# Patient Record
Sex: Female | Born: 1960 | Race: White | Hispanic: No | Marital: Single | Smoking: Former smoker
Health system: Southern US, Community
[De-identification: ages and names within clinical notes are randomized; demographics above are authoritative.]

## PROBLEM LIST (undated history)

## (undated) DIAGNOSIS — J309 Allergic rhinitis, unspecified: Secondary | ICD-10-CM

## (undated) DIAGNOSIS — M1990 Unspecified osteoarthritis, unspecified site: Secondary | ICD-10-CM

## (undated) DIAGNOSIS — I739 Peripheral vascular disease, unspecified: Secondary | ICD-10-CM

## (undated) DIAGNOSIS — I10 Essential (primary) hypertension: Secondary | ICD-10-CM

## (undated) DIAGNOSIS — E039 Hypothyroidism, unspecified: Secondary | ICD-10-CM

## (undated) DIAGNOSIS — Z87442 Personal history of urinary calculi: Secondary | ICD-10-CM

## (undated) HISTORY — PX: COLONOSCOPY: SHX174

## (undated) HISTORY — PX: TUBAL LIGATION: SHX77

---

## 2009-06-27 ENCOUNTER — Emergency Department: Payer: Self-pay | Admitting: Internal Medicine

## 2010-03-09 ENCOUNTER — Ambulatory Visit: Payer: Self-pay | Admitting: Obstetrics and Gynecology

## 2011-04-19 ENCOUNTER — Ambulatory Visit: Payer: Self-pay | Admitting: Family Medicine

## 2012-07-31 ENCOUNTER — Ambulatory Visit: Payer: Self-pay | Admitting: Family Medicine

## 2012-09-26 ENCOUNTER — Emergency Department: Payer: Self-pay | Admitting: Emergency Medicine

## 2012-09-26 LAB — URINALYSIS, COMPLETE
Bilirubin,UR: NEGATIVE
Glucose,UR: NEGATIVE mg/dL (ref 0–75)
Ketone: NEGATIVE
Leukocyte Esterase: NEGATIVE
Nitrite: NEGATIVE
Ph: 7 (ref 4.5–8.0)
Protein: NEGATIVE
RBC,UR: 24 /HPF (ref 0–5)
Specific Gravity: 1.018 (ref 1.003–1.030)
Squamous Epithelial: 1
WBC UR: 3 /HPF (ref 0–5)

## 2012-09-26 LAB — CBC
HCT: 39.8 % (ref 35.0–47.0)
HGB: 13.7 g/dL (ref 12.0–16.0)
MCH: 29 pg (ref 26.0–34.0)
MCHC: 34.4 g/dL (ref 32.0–36.0)
MCV: 84 fL (ref 80–100)
Platelet: 197 10*3/uL (ref 150–440)
RBC: 4.72 10*6/uL (ref 3.80–5.20)
RDW: 15.6 % — ABNORMAL HIGH (ref 11.5–14.5)
WBC: 7.7 10*3/uL (ref 3.6–11.0)

## 2012-09-26 LAB — COMPREHENSIVE METABOLIC PANEL
Albumin: 3.3 g/dL — ABNORMAL LOW (ref 3.4–5.0)
Alkaline Phosphatase: 99 U/L (ref 50–136)
Anion Gap: 8 (ref 7–16)
BUN: 17 mg/dL (ref 7–18)
Bilirubin,Total: 0.2 mg/dL (ref 0.2–1.0)
Calcium, Total: 8.5 mg/dL (ref 8.5–10.1)
Chloride: 105 mmol/L (ref 98–107)
Co2: 25 mmol/L (ref 21–32)
Creatinine: 0.82 mg/dL (ref 0.60–1.30)
EGFR (African American): >60
EGFR (Non-African Amer.): >60
Glucose: 129 mg/dL — ABNORMAL HIGH (ref 65–99)
Osmolality: 279 (ref 275–301)
Potassium: 3 mmol/L — ABNORMAL LOW (ref 3.5–5.1)
SGOT(AST): 21 U/L (ref 15–37)
SGPT (ALT): 22 U/L (ref 12–78)
Sodium: 138 mmol/L (ref 136–145)
Total Protein: 7.1 g/dL (ref 6.4–8.2)

## 2013-10-15 ENCOUNTER — Ambulatory Visit: Payer: Self-pay | Admitting: Family Medicine

## 2014-02-03 ENCOUNTER — Emergency Department: Payer: Self-pay | Admitting: Physician Assistant

## 2014-12-10 ENCOUNTER — Other Ambulatory Visit: Payer: Self-pay | Admitting: Family Medicine

## 2014-12-10 DIAGNOSIS — Z1231 Encounter for screening mammogram for malignant neoplasm of breast: Secondary | ICD-10-CM

## 2014-12-17 ENCOUNTER — Ambulatory Visit
Admission: RE | Admit: 2014-12-17 | Discharge: 2014-12-17 | Disposition: A | Discharge status: Home / Self Care | Payer: BLUE CROSS/BLUE SHIELD | Source: Ambulatory Visit | Attending: Family Medicine | Admitting: Family Medicine

## 2014-12-17 DIAGNOSIS — Z1231 Encounter for screening mammogram for malignant neoplasm of breast: Secondary | ICD-10-CM | POA: Diagnosis not present

## 2015-05-29 ENCOUNTER — Ambulatory Visit
Admission: RE | Admit: 2015-05-29 | Discharge: 2015-05-29 | Disposition: A | Discharge status: Home / Self Care | Payer: BLUE CROSS/BLUE SHIELD | Source: Ambulatory Visit | Attending: Family Medicine | Admitting: Family Medicine

## 2015-05-29 ENCOUNTER — Other Ambulatory Visit: Payer: Self-pay | Admitting: Family Medicine

## 2015-05-29 DIAGNOSIS — R600 Localized edema: Secondary | ICD-10-CM

## 2016-05-10 ENCOUNTER — Encounter: Payer: Self-pay | Admitting: *Deleted

## 2016-05-11 ENCOUNTER — Ambulatory Visit: Payer: BLUE CROSS/BLUE SHIELD | Admitting: Anesthesiology

## 2016-05-11 ENCOUNTER — Encounter: Payer: Self-pay | Admitting: Anesthesiology

## 2016-05-11 ENCOUNTER — Encounter
Admission: RE | Disposition: A | Discharge status: Home / Self Care | Payer: Self-pay | Source: Ambulatory Visit | Attending: Gastroenterology

## 2016-05-11 ENCOUNTER — Ambulatory Visit
Admission: RE | Admit: 2016-05-11 | Discharge: 2016-05-11 | Disposition: A | Discharge status: Home / Self Care | Payer: BLUE CROSS/BLUE SHIELD | Source: Ambulatory Visit | Attending: Gastroenterology | Admitting: Gastroenterology

## 2016-05-11 DIAGNOSIS — K6389 Other specified diseases of intestine: Secondary | ICD-10-CM | POA: Diagnosis not present

## 2016-05-11 DIAGNOSIS — Z87442 Personal history of urinary calculi: Secondary | ICD-10-CM | POA: Insufficient documentation

## 2016-05-11 DIAGNOSIS — I10 Essential (primary) hypertension: Secondary | ICD-10-CM | POA: Diagnosis not present

## 2016-05-11 DIAGNOSIS — I739 Peripheral vascular disease, unspecified: Secondary | ICD-10-CM | POA: Diagnosis not present

## 2016-05-11 DIAGNOSIS — Z79899 Other long term (current) drug therapy: Secondary | ICD-10-CM | POA: Diagnosis not present

## 2016-05-11 DIAGNOSIS — K635 Polyp of colon: Secondary | ICD-10-CM | POA: Diagnosis not present

## 2016-05-11 DIAGNOSIS — Z1211 Encounter for screening for malignant neoplasm of colon: Secondary | ICD-10-CM | POA: Insufficient documentation

## 2016-05-11 DIAGNOSIS — Z86718 Personal history of other venous thrombosis and embolism: Secondary | ICD-10-CM | POA: Diagnosis not present

## 2016-05-11 DIAGNOSIS — E039 Hypothyroidism, unspecified: Secondary | ICD-10-CM | POA: Diagnosis not present

## 2016-05-11 DIAGNOSIS — K644 Residual hemorrhoidal skin tags: Secondary | ICD-10-CM | POA: Insufficient documentation

## 2016-05-11 DIAGNOSIS — D125 Benign neoplasm of sigmoid colon: Secondary | ICD-10-CM | POA: Insufficient documentation

## 2016-05-11 HISTORY — DX: Personal history of urinary calculi: Z87.442

## 2016-05-11 HISTORY — DX: Essential (primary) hypertension: I10

## 2016-05-11 HISTORY — DX: Peripheral vascular disease, unspecified: I73.9

## 2016-05-11 HISTORY — DX: Hypothyroidism, unspecified: E03.9

## 2016-05-11 HISTORY — PX: COLONOSCOPY WITH PROPOFOL: SHX5780

## 2016-05-11 HISTORY — DX: Unspecified osteoarthritis, unspecified site: M19.90

## 2016-05-11 HISTORY — DX: Allergic rhinitis, unspecified: J30.9

## 2016-05-11 SURGERY — COLONOSCOPY WITH PROPOFOL
Anesthesia: General

## 2016-05-11 MED ORDER — LIDOCAINE 2% (20 MG/ML) 5 ML SYRINGE
INTRAMUSCULAR | Status: DC | PRN
  Administered 2016-05-11: 40 mg via INTRAVENOUS

## 2016-05-11 MED ORDER — LIDOCAINE HCL (PF) 1 % IJ SOLN
2.0000 mL | Freq: Once | INTRAMUSCULAR | Status: AC
  Administered 2016-05-11: 0.03 mL via INTRADERMAL

## 2016-05-11 MED ORDER — PROPOFOL 10 MG/ML IV BOLUS
INTRAVENOUS | Status: DC | PRN
  Administered 2016-05-11: 100 mg via INTRAVENOUS

## 2016-05-11 MED ORDER — MIDAZOLAM HCL 5 MG/5ML IJ SOLN
INTRAMUSCULAR | Status: DC | PRN
  Administered 2016-05-11: 1 mg via INTRAVENOUS

## 2016-05-11 MED ORDER — PROPOFOL 500 MG/50ML IV EMUL
INTRAVENOUS | Status: DC | PRN
  Administered 2016-05-11: 160 ug/kg/min via INTRAVENOUS

## 2016-05-11 MED ORDER — FENTANYL CITRATE (PF) 100 MCG/2ML IJ SOLN
INTRAMUSCULAR | Status: AC
  Filled 2016-05-11: qty 2

## 2016-05-11 MED ORDER — SODIUM CHLORIDE 0.9 % IV SOLN
INTRAVENOUS | Status: DC
  Administered 2016-05-11: 12:00:00 via INTRAVENOUS

## 2016-05-11 MED ORDER — FENTANYL CITRATE (PF) 100 MCG/2ML IJ SOLN
INTRAMUSCULAR | Status: DC | PRN
  Administered 2016-05-11: 50 ug via INTRAVENOUS

## 2016-05-11 MED ORDER — SODIUM CHLORIDE 0.9 % IV SOLN
INTRAVENOUS | Status: DC
  Administered 2016-05-11: 1000 mL via INTRAVENOUS

## 2016-05-11 MED ORDER — PROPOFOL 10 MG/ML IV BOLUS
INTRAVENOUS | Status: AC
  Filled 2016-05-11: qty 20

## 2016-05-11 MED ORDER — MIDAZOLAM HCL 2 MG/2ML IJ SOLN
INTRAMUSCULAR | Status: AC
  Filled 2016-05-11: qty 2

## 2016-05-11 MED ORDER — PROPOFOL 500 MG/50ML IV EMUL
INTRAVENOUS | Status: AC
  Filled 2016-05-11: qty 50

## 2016-05-11 MED ORDER — LIDOCAINE HCL (PF) 1 % IJ SOLN
INTRAMUSCULAR | Status: AC
  Administered 2016-05-11: 0.03 mL via INTRADERMAL
  Filled 2016-05-11: qty 2

## 2016-05-11 NOTE — Op Note (Signed)
[facility] Gastroenterology Patient Name: Carrie Cole Procedure Date: 05/11/2016 12:29 PM MRN: [id number] Account #: [id number] Date of Birth: 11/01/1960 Admit Type: Outpatient Age: 56 Room: [facility] ENDO ROOM 3 Gender: Female Note Status: Finalized Procedure:            Colonoscopy Indications:          Screening for colorectal malignant neoplasm Providers:            Lollie Sails, MD Referring MD:         Margaretha Glassing, MD (Referring MD) Medicines:            Monitored Anesthesia Care Complications:        No immediate complications. Procedure:            Pre-Anesthesia Assessment:                       - ASA Grade Assessment: III - A patient with severe                        systemic disease.                       After obtaining informed consent, the colonoscope was                        passed under direct vision. Throughout the procedure,                        the patient's blood pressure, pulse, and oxygen                        saturations were monitored continuously. The                        Colonoscope was introduced through the anus and                        advanced to the the cecum, identified by appendiceal                        orifice and ileocecal valve. The colonoscopy was                        performed without difficulty. The patient tolerated the                        procedure well. The quality of the bowel preparation                        was good except the ascending colon was fair. Findings:      A 3 mm polyp was found in the mid sigmoid colon. The polyp was sessile.       The polyp was removed with a cold biopsy forceps. Resection and       retrieval were complete.      Three sessile polyps were found in the distal sigmoid colon. The polyps       were 1 to 2 mm in size. These polyps were removed with a cold biopsy       forceps. Resection and retrieval were complete.      A diffuse area  of mild melanosis was  found in the entire colon.      The retroflexed view of the distal rectum and anal verge was normal and       showed no anal or rectal abnormalities, note prominant anal pillars.      The digital rectal exam was normal.      The perianal exam findings include skin tags. Impression:           - One 3 mm polyp in the mid sigmoid colon, removed with                        a cold biopsy forceps. Resected and retrieved.                       - Three 1 to 2 mm polyps in the distal sigmoid colon,                        removed with a cold biopsy forceps. Resected and                        retrieved.                       - Melanosis in the colon.                       - The distal rectum and anal verge are normal on                        retroflexion view.                       - Perianal skin tags found on perianal exam. Recommendation:       - Discharge patient to home.                       - Telephone GI clinic for pathology results in 1 week. Procedure Code(s):    --- Professional ---                       [phone], Colonoscopy, flexible; with biopsy, single or                        multiple Diagnosis Code(s):    --- Professional ---                       D12.5, Benign neoplasm of sigmoid colon                       Z12.11, Encounter for screening for malignant neoplasm                        of colon                       K63.89, Other specified diseases of intestine                       K64.4, Residual hemorrhoidal skin tags CPT copyright 2016 American Medical Association. All rights reserved. The codes documented in this report are preliminary and upon coder review may  be revised to meet  current compliance requirements. Lollie Sails, MD 05/11/2016 1:14:18 PM This report has been signed electronically. Number of Addenda: 0 Note Initiated On: 05/11/2016 12:29 PM Scope Withdrawal Time: 0 hours 16 minutes 40 seconds  Total Procedure Duration: 0 hours 30 minutes 16 seconds        [facility]

## 2016-05-11 NOTE — Anesthesia Postprocedure Evaluation (Signed)
Anesthesia Post Note  Patient: Carrie Cole  Procedure(s) Performed: Procedure(s) (LRB): COLONOSCOPY WITH PROPOFOL (N/A)  Patient location during evaluation: Endoscopy Anesthesia Type: General Level of consciousness: awake and alert Pain management: pain level controlled Vital Signs Assessment: post-procedure vital signs reviewed and stable Respiratory status: spontaneous breathing, nonlabored ventilation, respiratory function stable and patient connected to nasal cannula oxygen Cardiovascular status: blood pressure returned to baseline and stable Postop Assessment: no signs of nausea or vomiting Anesthetic complications: no     Last Vitals:  Vitals:   05/11/16 1327 05/11/16 1337  BP: 101/71 115/83  Pulse: 64 (!) 52  Resp: 15 12  Temp:      Last Pain:  Vitals:   05/11/16 1207  TempSrc: Tympanic                 Precious Haws Daril Warga

## 2016-05-11 NOTE — Anesthesia Preprocedure Evaluation (Signed)
Anesthesia Evaluation  Patient identified by MRN, date of birth, ID band Patient awake    Reviewed: Allergy & Precautions, NPO status , Patient's Chart, lab work & pertinent test results, reviewed documented beta blocker date and time   Airway Mallampati: II  TM Distance: >3 FB     Dental  (+) Chipped   Pulmonary former smoker,           Cardiovascular hypertension, Pt. on medications + Peripheral Vascular Disease       Neuro/Psych    GI/Hepatic   Endo/Other  Hypothyroidism   Renal/GU      Musculoskeletal  (+) Arthritis ,   Abdominal   Peds  Hematology   Anesthesia Other Findings   Reproductive/Obstetrics                             Anesthesia Physical Anesthesia Plan  ASA: III  Anesthesia Plan: General   Post-op Pain Management:    Induction: Intravenous  Airway Management Planned:   Additional Equipment:   Intra-op Plan:   Post-operative Plan:   Informed Consent: I have reviewed the patients History and Physical, chart, labs and discussed the procedure including the risks, benefits and alternatives for the proposed anesthesia with the patient or authorized representative who has indicated his/her understanding and acceptance.     Plan Discussed with: CRNA  Anesthesia Plan Comments:         Anesthesia Quick Evaluation

## 2016-05-11 NOTE — H&P (Signed)
Outpatient short stay form Pre-procedure 05/11/2016 12:10 PM Lollie Sails MD  Primary Physician: Dr. Maryland Pink  Reason for visit:  Screening colonoscopy  History of present illness:  Patient is a 56 year old female presenting today as above. She did have a colonoscopy over 10 years ago. She tolerated her prep well. She takes no aspirin or blood thinning agents.    Current Facility-Administered Medications:  .  0.9 %  sodium chloride infusion, , Intravenous, Continuous, Lollie Sails, MD .  0.9 %  sodium chloride infusion, , Intravenous, Continuous, Lollie Sails, MD .  lidocaine (PF) (XYLOCAINE) 1 % injection 2 mL, 2 mL, Intradermal, Once, Lollie Sails, MD  Prescriptions Prior to Admission  Medication Sig Dispense Refill Last Dose  . indapamide (LOZOL) 2.5 MG tablet Take 2.5 mg by mouth daily.     Marland Kitchen levothyroxine (SYNTHROID, LEVOTHROID) 75 MCG tablet Take 75 mcg by mouth daily before breakfast.        No Known Allergies   Past Medical History:  Diagnosis Date  . Allergic rhinitis   . Arthritis   . History of kidney stones   . Hypertension   . Hypothyroidism   . Peripheral vascular disease ([place])    DVT  (Left Leg)    Review of systems:      Physical Exam    Heart and lungs: Regular rate and rhythm without rub or gallop lungs are bilaterally clear.    HEENT: Normocephalic atraumatic eyes are anicteric    Other:     Pertinant exam for procedure: Soft nontender nondistended bowel sounds positive normoactive.    Planned proceedures: Colonoscopy and indicated procedures. I have discussed the risks benefits and complications of procedures to include not limited to bleeding, infection, perforation and the risk of sedation and the patient wishes to proceed.    Lollie Sails, MD Gastroenterology 05/11/2016  12:10 PM

## 2016-05-11 NOTE — Transfer of Care (Signed)
Immediate Anesthesia Transfer of Care Note  Patient: Carrie Cole  Procedure(s) Performed: Procedure(s): COLONOSCOPY WITH PROPOFOL (N/A)  Patient Location: PACU and Endoscopy Unit  Anesthesia Type:General  Level of Consciousness: awake, drowsy and patient cooperative  Airway & Oxygen Therapy: Patient Spontanous Breathing and Patient connected to nasal cannula oxygen  Post-op Assessment: Report given to RN and Post -op Vital signs reviewed and stable  Post vital signs: Reviewed and stable  Last Vitals:  Vitals:   05/11/16 1207  BP: (!) 147/63  Pulse: (!) 56  Resp: 20  Temp: 36.2 C    Last Pain:  Vitals:   05/11/16 1207  TempSrc: Tympanic         Complications: No apparent anesthesia complications

## 2016-05-11 NOTE — Anesthesia Post-op Follow-up Note (Cosign Needed)
Anesthesia QCDR form completed.        

## 2016-05-13 LAB — SURGICAL PATHOLOGY

## 2016-05-18 ENCOUNTER — Encounter: Payer: Self-pay | Admitting: Gastroenterology

## 2017-05-14 ENCOUNTER — Encounter: Payer: Self-pay | Admitting: Emergency Medicine

## 2017-05-14 ENCOUNTER — Emergency Department
Admission: EM | Admit: 2017-05-14 | Discharge: 2017-05-14 | Disposition: A | Discharge status: Home / Self Care | Payer: BLUE CROSS/BLUE SHIELD | Attending: Student in an Organized Health Care Education/Training Program | Admitting: Student in an Organized Health Care Education/Training Program

## 2017-05-14 ENCOUNTER — Emergency Department: Payer: BLUE CROSS/BLUE SHIELD

## 2017-05-14 DIAGNOSIS — E039 Hypothyroidism, unspecified: Secondary | ICD-10-CM | POA: Insufficient documentation

## 2017-05-14 DIAGNOSIS — Z87891 Personal history of nicotine dependence: Secondary | ICD-10-CM | POA: Diagnosis not present

## 2017-05-14 DIAGNOSIS — Y9389 Activity, other specified: Secondary | ICD-10-CM | POA: Diagnosis not present

## 2017-05-14 DIAGNOSIS — M545 Low back pain, unspecified: Secondary | ICD-10-CM

## 2017-05-14 DIAGNOSIS — I10 Essential (primary) hypertension: Secondary | ICD-10-CM | POA: Diagnosis not present

## 2017-05-14 DIAGNOSIS — X500XXA Overexertion from strenuous movement or load, initial encounter: Secondary | ICD-10-CM | POA: Insufficient documentation

## 2017-05-14 DIAGNOSIS — Z79899 Other long term (current) drug therapy: Secondary | ICD-10-CM | POA: Diagnosis not present

## 2017-05-14 DIAGNOSIS — Y990 Civilian activity done for income or pay: Secondary | ICD-10-CM | POA: Insufficient documentation

## 2017-05-14 DIAGNOSIS — Y9289 Other specified places as the place of occurrence of the external cause: Secondary | ICD-10-CM | POA: Diagnosis not present

## 2017-05-14 LAB — CBC
HCT: 40.3 % (ref 35.0–47.0)
Hemoglobin: 13.5 g/dL (ref 12.0–16.0)
MCH: 29.3 pg (ref 26.0–34.0)
MCHC: 33.5 g/dL (ref 32.0–36.0)
MCV: 87.5 fL (ref 80.0–100.0)
Platelets: 232 10*3/uL (ref 150–440)
RBC: 4.61 MIL/uL (ref 3.80–5.20)
RDW: 15.2 % — ABNORMAL HIGH (ref 11.5–14.5)
WBC: 6.7 10*3/uL (ref 3.6–11.0)

## 2017-05-14 LAB — COMPREHENSIVE METABOLIC PANEL
ALT: 20 U/L (ref 14–54)
AST: 20 U/L (ref 15–41)
Albumin: 3.9 g/dL (ref 3.5–5.0)
Alkaline Phosphatase: 100 U/L (ref 38–126)
Anion gap: 5 (ref 5–15)
BUN: 14 mg/dL (ref 6–20)
CO2: 24 mmol/L (ref 22–32)
Calcium: 8.6 mg/dL — ABNORMAL LOW (ref 8.9–10.3)
Chloride: 107 mmol/L (ref 101–111)
Creatinine, Ser: 0.49 mg/dL (ref 0.44–1.00)
GFR calc Af Amer: >60 mL/min (ref >60)
GFR calc non Af Amer: >60 mL/min (ref >60)
Glucose, Bld: 96 mg/dL (ref 65–99)
Potassium: 4.3 mmol/L (ref 3.5–5.1)
Sodium: 136 mmol/L (ref 135–145)
Total Bilirubin: 0.7 mg/dL (ref 0.3–1.2)
Total Protein: 7.4 g/dL (ref 6.5–8.1)

## 2017-05-14 LAB — URINALYSIS, COMPLETE (UACMP) WITH MICROSCOPIC
Bacteria, UA: NONE SEEN
Bilirubin Urine: NEGATIVE
Glucose, UA: NEGATIVE mg/dL
Hgb urine dipstick: NEGATIVE
Ketones, ur: NEGATIVE mg/dL
Nitrite: NEGATIVE
Protein, ur: NEGATIVE mg/dL
Specific Gravity, Urine: 1.02 (ref 1.005–1.030)
pH: 6 (ref 5.0–8.0)

## 2017-05-14 LAB — LIPASE, BLOOD: Lipase: 28 U/L (ref 11–51)

## 2017-05-14 MED ORDER — HYDROCODONE-ACETAMINOPHEN 5-325 MG PO TABS: 1.0000 | ORAL_TABLET | ORAL | 0 refills | Status: DC | PRN

## 2017-05-14 MED ORDER — CYCLOBENZAPRINE HCL 5 MG PO TABS: 5.0000 mg | ORAL_TABLET | Freq: Three times a day (TID) | ORAL | 0 refills | Status: DC | PRN

## 2017-05-14 NOTE — ED Provider Notes (Signed)
[facility] Emergency Department Provider Note    First MD Initiated Contact with Patient 05/14/17 1617     (approximate)  I have reviewed the triage vital signs and the nursing notes.   HISTORY  Chief Complaint Back Pain and Emesis    HPI Carrie Cole is a 57 y.o. female with history of kidney stones roughly 1 decade ago presents with acute right low back pain worsened with movement.  No fevers.  No dysuria or hematuria.  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  Denies any chest pain or shortness of breath.  States the pain is mild to moderate with some improvement after muscle relaxant that she took at home as well as with naproxen.  Past Medical History:  Diagnosis Date  . Allergic rhinitis   . Arthritis   . History of kidney stones   . Hypertension   . Hypothyroidism   . Peripheral vascular disease ([facility])    DVT  (Left Leg)   Family History  Problem Relation Age of Onset  . Breast cancer Sister        69's  . Breast cancer Paternal Aunt   . Diabetes Mellitus II Father   . Heart disease Father   . Heart attack Father   . Hypertension Father    Past Surgical History:  Procedure Laterality Date  . COLONOSCOPY    . COLONOSCOPY WITH PROPOFOL N/A 05/11/2016   Procedure: COLONOSCOPY WITH PROPOFOL;  Surgeon: Lollie Sails, MD;  Location: [facility] ENDOSCOPY;  Service: Endoscopy;  Laterality: N/A;  . TUBAL LIGATION     There are no active problems to display for this patient.     Prior to Admission medications   Medication Sig Start Date End Date Taking? Authorizing Provider  levothyroxine (SYNTHROID, LEVOTHROID) 75 MCG tablet Take 75 mcg by mouth daily before breakfast.   Yes [provider]  cyclobenzaprine (FLEXERIL) 5 MG tablet Take 1 tablet (5 mg total) by mouth 3 (three) times daily as needed for muscle spasms. 05/14/17   Merlyn Lot, MD    HYDROcodone-acetaminophen (NORCO) 5-325 MG tablet Take 1 tablet by mouth every 4 (four) hours as needed for moderate pain. 05/14/17   Merlyn Lot, MD    Allergies Patient has no known allergies.    Social History Social History   Tobacco Use  . Smoking status: Former Research scientist (life sciences)  . Smokeless tobacco: Never Used  Substance Use Topics  . Alcohol use: No  . Drug use: No    Review of Systems Patient denies headaches, rhinorrhea, blurry vision, numbness, shortness of breath, chest pain, edema, cough, abdominal pain, nausea, vomiting, diarrhea, dysuria, fevers, rashes or hallucinations unless otherwise stated above in HPI. ____________________________________________   PHYSICAL EXAM:  VITAL SIGNS: Vitals:   05/14/17 1410  BP: (!) 141/57  Pulse: (!) 56  Resp: 20  Temp: 98.3 F (36.8 C)  SpO2: 97%    Constitutional: Alert and oriented. Well appearing and in no acute distress. Eyes: Conjunctivae are normal.  Head: Atraumatic. Nose: No congestion/rhinnorhea. Mouth/Throat: Mucous membranes are moist.   Neck: No stridor. Painless ROM.  Cardiovascular: Normal rate, regular rhythm. Grossly normal heart sounds.  Good peripheral circulation. Respiratory: Normal respiratory effort.  No retractions. Lungs CTAB. Gastrointestinal: Soft and nontender. No distention. No abdominal bruits. No CVA tenderness. Genitourinary: deferred Musculoskeletal:  Right paralumbar psinal ttp, no step offs or deformities No lower extremity tenderness nor  edema.  No joint effusions. Neurologic:  Normal speech and language. No gross focal neurologic deficits are appreciated. No facial droop Skin:  Skin is warm, dry and intact. No rash noted. Psychiatric: Mood and affect are normal. Speech and behavior are normal.  ____________________________________________   LABS (all labs ordered are listed, but only abnormal results are displayed)  Results for orders placed or performed during the hospital  encounter of 05/14/17 (from the past 24 hour(s))  Lipase, blood     Status: None   Collection Time: 05/14/17  2:21 PM  Result Value Ref Range   Lipase 28 11 - 51 U/L  Comprehensive metabolic panel     Status: Abnormal   Collection Time: 05/14/17  2:21 PM  Result Value Ref Range   Sodium 136 135 - 145 mmol/L   Potassium 4.3 3.5 - 5.1 mmol/L   Chloride 107 101 - 111 mmol/L   CO2 24 22 - 32 mmol/L   Glucose, Bld 96 65 - 99 mg/dL   BUN 14 6 - 20 mg/dL   Creatinine, Ser 0.49 0.44 - 1.00 mg/dL   Calcium 8.6 (L) 8.9 - 10.3 mg/dL   Total Protein 7.4 6.5 - 8.1 g/dL   Albumin 3.9 3.5 - 5.0 g/dL   AST 20 15 - 41 U/L   ALT 20 14 - 54 U/L   Alkaline Phosphatase 100 38 - 126 U/L   Total Bilirubin 0.7 0.3 - 1.2 mg/dL   GFR calc non Af Amer >60 >60 mL/min   GFR calc Af Amer >60 >60 mL/min   Anion gap 5 5 - 15  CBC     Status: Abnormal   Collection Time: 05/14/17  2:21 PM  Result Value Ref Range   WBC 6.7 3.6 - 11.0 K/uL   RBC 4.61 3.80 - 5.20 MIL/uL   Hemoglobin 13.5 12.0 - 16.0 g/dL   HCT 40.3 35.0 - 47.0 %   MCV 87.5 80.0 - 100.0 fL   MCH 29.3 26.0 - 34.0 pg   MCHC 33.5 32.0 - 36.0 g/dL   RDW 15.2 (H) 11.5 - 14.5 %   Platelets 232 150 - 440 K/uL  Urinalysis, Complete w Microscopic     Status: Abnormal   Collection Time: 05/14/17  2:21 PM  Result Value Ref Range   Color, Urine YELLOW (A) YELLOW   APPearance HAZY (A) CLEAR   Specific Gravity, Urine 1.020 1.005 - 1.030   pH 6.0 5.0 - 8.0   Glucose, UA NEGATIVE NEGATIVE mg/dL   Hgb urine dipstick NEGATIVE NEGATIVE   Bilirubin Urine NEGATIVE NEGATIVE   Ketones, ur NEGATIVE NEGATIVE mg/dL   Protein, ur NEGATIVE NEGATIVE mg/dL   Nitrite NEGATIVE NEGATIVE   Leukocytes, UA TRACE (A) NEGATIVE   RBC / HPF 0-5 0 - 5 RBC/hpf   WBC, UA 0-5 0 - 5 WBC/hpf   Bacteria, UA NONE SEEN NONE SEEN   Squamous Epithelial / LPF 0-5 0 - 5   Mucus PRESENT     ____________________________________________ ____________________________________________  RADIOLOGY  I personally reviewed all radiographic images ordered to evaluate for the above acute complaints and reviewed radiology reports and findings.  These findings were personally discussed with the patient.  Please see medical record for radiology report.  ____________________________________________   PROCEDURES  Procedure(s) performed:  Procedures    Critical Care performed: no ____________________________________________   INITIAL IMPRESSION / ASSESSMENT AND PLAN / ED COURSE  Pertinent labs & imaging results that were available during my care of the patient  were reviewed by me and considered in my medical decision making (see chart for details).  DDX: stone, msk strain, lumbago, unlikely ce or ss  Petrina Melby is a 57 y.o. who presents to the ED with low back pain as described above. No history of injury or trauma. No recent back instrumentation/procedures. No fevers. Denies cord compression symptoms. No bowel/bladder incontinence or retention, no LE weakness. VSS in ED. Exam with no LE weakness bilat., no sensory deficits, normal DTRs, no clonus, no saddle anesthesia. Pain with palpation of back and with trunk movement. Likely MSK related pain, probable lumbar strain.  History and physical exam less consistent with kidney stone or pyelonephritis. clinical picture is not consistent with epidural abscess , fracture, or cauda equina syndrome. Plan supportive care, follow up for recheck       As part of my medical decision making, I reviewed the following data within the [place] notes reviewed and incorporated, Labs reviewed, notes from prior ED visits and [place] Controlled Substance Database   ____________________________________________   FINAL CLINICAL IMPRESSION(S) / ED DIAGNOSES  Final diagnoses:  Acute right-sided low back pain without  sciatica      NEW MEDICATIONS STARTED DURING THIS VISIT:  New Prescriptions   CYCLOBENZAPRINE (FLEXERIL) 5 MG TABLET    Take 1 tablet (5 mg total) by mouth 3 (three) times daily as needed for muscle spasms.   HYDROCODONE-ACETAMINOPHEN (NORCO) 5-325 MG TABLET    Take 1 tablet by mouth every 4 (four) hours as needed for moderate pain.     Note:  This document was prepared using Dragon voice recognition software and may include unintentional dictation errors.    Merlyn Lot, MD 05/14/17 [phone]

## 2017-05-14 NOTE — Discharge Instructions (Signed)

## 2017-05-14 NOTE — ED Triage Notes (Signed)
Pt reports has some lower back pain for the past 2 weeks worsening over the last couple of days. Pt reports some nausea as well and states that she felt this way with her kidney stones.

## 2018-07-18 ENCOUNTER — Other Ambulatory Visit: Payer: Self-pay | Admitting: Family Medicine

## 2018-07-18 DIAGNOSIS — Z1231 Encounter for screening mammogram for malignant neoplasm of breast: Secondary | ICD-10-CM

## 2018-08-28 ENCOUNTER — Other Ambulatory Visit: Payer: Self-pay

## 2018-08-28 ENCOUNTER — Ambulatory Visit
Admission: RE | Admit: 2018-08-28 | Discharge: 2018-08-28 | Disposition: A | Discharge status: Home / Self Care | Payer: BC Managed Care – PPO | Source: Ambulatory Visit | Attending: Family Medicine | Admitting: Family Medicine

## 2018-08-28 DIAGNOSIS — Z1231 Encounter for screening mammogram for malignant neoplasm of breast: Secondary | ICD-10-CM | POA: Diagnosis present

## 2018-11-27 ENCOUNTER — Ambulatory Visit
Admission: RE | Admit: 2018-11-27 | Discharge: 2018-11-27 | Disposition: A | Discharge status: Home / Self Care | Payer: BC Managed Care – PPO | Source: Ambulatory Visit | Attending: Orthopedic Surgery | Admitting: Orthopedic Surgery

## 2018-11-27 ENCOUNTER — Other Ambulatory Visit: Payer: Self-pay | Admitting: Orthopedic Surgery

## 2018-11-27 ENCOUNTER — Other Ambulatory Visit: Payer: Self-pay

## 2018-11-27 DIAGNOSIS — M1712 Unilateral primary osteoarthritis, left knee: Secondary | ICD-10-CM | POA: Diagnosis present

## 2019-04-09 NOTE — H&P (Signed)
Chief Complaint:   Carrie Cole is a 59 y.o. female here for Pre Op Consulting (sign consents)  History of Present Illness:  Patient is an established patient who presents for a preoperative visit to schedule a D&C, hysteroscopy, and polypectomy. Indications are postmenopausal bleeding and endometrial polyps. See work up below.  Workup has included: Pap smear 02/2019: neg/neg  EMBx 02/2019: benign   TVUS 03/2019:  FIBROID UT UT= 8.23 X 5.26 X 5.26 CM 1 POST=22 MM 2 POST= 11 MM 3 RT ANT= 16 MM ENDOMETRIUM=10.86 MM bil ovs wnl  SIS 03/2019: Findings consistent with endometrial polyp(s) x2 that are 1 cm.  Pertinent Surgical Hx -Body mass index is 47.38 kg/m. -BTL in 1984  Past Medical History:  has a past medical history of Arthritis, DVT (deep venous thrombosis) (CMS-HCC) (2009), Edema, Hemorrhoids, Hyperplastic colon polyp (05/11/2016), Hypertension, Hypothyroidism, Kidney stones, Obesity, unspecified, PMB (postmenopausal bleeding), and Thyroid disease.  Past Surgical History:  has a past surgical history that includes Colonoscopy (09/27/2003); Tubal ligation (01/04/1982); egd (09/27/2003); and Colonoscopy (05/11/2016). Family History: family history includes Allergic rhinitis in her mother; Allergies in her father and sister; Breast cancer in her sister; Diabetes type II in her father; Heart disease in her father; High blood pressure (Hypertension) in her father; Myocardial Infarction (Heart attack) in her father; Stroke in her father. Social History:  reports that she has quit smoking. She has never used smokeless tobacco. She reports that she does not drink alcohol or use drugs. OB/GYN History:  OB History    Gravida  3   Para  3   Term  3   Preterm      AB      Living  3     SAB      TAB      Ectopic      Molar      Multiple      Live Births           Allergies: has No Known Allergies. Medications:  Current Outpatient Medications:  .  indapamide  (LOZOL) 2.5 MG tablet, Take 1 tablet (2.5 mg total) by mouth once daily, Disp: 90 tablet, Rfl: 3 .  levothyroxine (SYNTHROID) 75 MCG tablet, Take 1 tablet (75 mcg total) by mouth once daily Take on an empty stomach with a glass of water at least 30-60 minutes before breakfast., Disp: 90 tablet, Rfl: 3 .  meloxicam (MOBIC) 15 MG tablet, Take 1 tablet (15 mg total) by mouth once daily as needed for Pain, Disp: 30 tablet, Rfl: 1   Exam:   BP 147/74   Ht 162.6 cm (5\' 4" )   Wt (!) 125.2 kg (276 lb)   BMI 47.38 kg/m   General: Patient is well-groomed, well-nourished, appears stated age in no acute distress  HEENT: head is atraumatic and normocephalic, trachea is midline, neck is supple with no palpable nodules  CV: Regular rhythm and normal heart rate, no murmur  Pulm: Clear to auscultation throughout lung fields with no wheezing, crackles, or rhonchi. No increased work of breathing  Abdomen: soft , no mass, non-tender, no rebound tenderness, no hepatomegaly  Pelvic:  deferred  Impression:   The primary encounter diagnosis was Post-menopausal bleeding. A diagnosis of Endometrial polyp was also pertinent to this visit.  Plan:   1. Preoperative visit: D&C hysteroscopy, polypectomy. Consents signed today. Risks of surgery were discussed with the patient including but not limited to: bleeding which may require transfusion; infection which may require antibiotics; injury  to uterus or surrounding organs; intrauterine scarring which may impair future fertility; need for additional procedures including laparotomy or laparoscopy; and other postoperative/anesthesia complications. Written informed consent was obtained.  This is a scheduled same-day surgery. She will have a postop visit in 2 weeks to review operative findings and pathology.  Diagnoses and all orders for this visit:  Post-menopausal bleeding  Endometrial polyp   Return for 2 week Postop  check.  ~~~~~~~~~~~~~~~~~~~~~~~~~~~~~~~~~~~~~~~~~~~~~~~~~~~~~~~~~~~~ This note is partially written by Priscella Mann, in the presence of and acting as the scribe of Dr. Benjaman Kindler, who has reviewed, edited and added to the note to reflect her best personal medical judgment.  This note was generated in part with voice recognition software and I apologize for any typographical errors that were not detected and corrected.  Carrie George, MD

## 2019-04-11 ENCOUNTER — Other Ambulatory Visit: Payer: Self-pay | Admitting: Obstetrics and Gynecology

## 2019-04-24 ENCOUNTER — Encounter
Admission: RE | Admit: 2019-04-24 | Discharge: 2019-04-24 | Disposition: A | Discharge status: Home / Self Care | Payer: BC Managed Care – PPO | Source: Ambulatory Visit | Attending: Obstetrics and Gynecology | Admitting: Obstetrics and Gynecology

## 2019-04-24 ENCOUNTER — Other Ambulatory Visit: Payer: Self-pay

## 2019-04-24 NOTE — Patient Instructions (Signed)
Your procedure is scheduled on: Monday 4/26 Report to Day Surgery. To find out your arrival time please call [phone] between 1PM - 3PM on        Friday 4/23  Remember: Instructions that are not followed completely may result in serious medical risk,  up to and including death, or upon the discretion of your surgeon and anesthesiologist your  surgery may need to be rescheduled.     _X__ 1. Do not eat food after midnight the night before your procedure.                 No gum chewing or hard candies. You may drink clear liquids up to 2 hours                 before you are scheduled to arrive for your surgery- DO not drink clear                 liquids within 2 hours of the start of your surgery.                 Clear Liquids include:  water, apple juice without pulp, clear Gatorade, G2 or                  Gatorade Zero (avoid Red/Purple/Blue), Black Coffee or Tea (Do not add                 anything to coffee or tea). _x____2.   Complete the carbohydrate drink provided to you, 2 hours before arrival.  __X__2.  On the morning of surgery brush your teeth with toothpaste and water, you                may rinse your mouth with mouthwash if you wish.  Do not swallow any toothpaste of mouthwash.     _X__ 3.  No Alcohol for 24 hours before or after surgery.   _X__ 4.  Do Not Smoke or use e-cigarettes For 24 Hours Prior to Your Surgery.                 Do not use any chewable tobacco products for at least 6 hours prior to                 Surgery.  ___  5.  Do not use any recreational drugs (marijuana, cocaine, heroin, ecstacy, MDMA or other)                For at least one week prior to your surgery.  Combination of these drugs with anesthesia                May have life threatening results.  ____  6.  Bring all medications with you on the day of surgery if instructed.   _x___  7.  Notify your doctor if there is any change in your medical condition      (cold,  fever, infections).     Do not wear jewelry, make-up, hairpins, clips or nail polish. Do not wear lotions, powders, or perfumes. You may wear deodorant. Do not shave 48 hours prior to surgery.  Do not bring valuables to the hospital.    [facility] is not responsible for any belongings or valuables.  Contacts, dentures or bridgework may not be worn into surgery. Leave your suitcase in the car. After surgery it may be brought to your room. For patients admitted to the hospital, discharge time is determined by  your treatment team.   Patients discharged the day of surgery will not be allowed to drive home.   Make arrangements for someone to be with you for the first 24 hours of your Same Day Discharge.    Please read over the following fact sheets that you were given:    _x___ Take these medicines the morning of surgery with A SIP OF WATER:    1. levothyroxine (SYNTHROID, LEVOTHROID) 75 MCG tablet  2.   3.   4.  5.  6.  ____ Fleet Enema (as directed)   _X___ SHOWER THE NIGHT BEFORE AND THE MORNING OF SURGERY.  ____ Use Benzoyl Peroxide Gel as instructed  ____ Use inhalers on the day of surgery  ____ Stop metformin 2 days prior to surgery    ____ Take 1/2 of usual insulin dose the night before surgery. No insulin the morning          of surgery.   ____ Stop Coumadin/Plavix/aspirin on   __x__ Stop Anti-inflammatories  meloxicam (MOBIC) 15 MG tablet, IBUPROFEN, ALEVE OR ASPIRIN UNTIL AFTER SURGERY   MAY TAKE TYLENOL   __X__ Stop supplements until after surgery.    ____ Bring C-Pap to the hospital.

## 2019-04-26 ENCOUNTER — Other Ambulatory Visit: Payer: Self-pay

## 2019-04-26 ENCOUNTER — Encounter
Admission: RE | Admit: 2019-04-26 | Discharge: 2019-04-26 | Disposition: A | Discharge status: Home / Self Care | Payer: BC Managed Care – PPO | Source: Ambulatory Visit | Attending: Obstetrics and Gynecology | Admitting: Obstetrics and Gynecology

## 2019-04-26 ENCOUNTER — Other Ambulatory Visit: Payer: BC Managed Care – PPO

## 2019-04-26 DIAGNOSIS — Z01818 Encounter for other preprocedural examination: Secondary | ICD-10-CM | POA: Insufficient documentation

## 2019-04-26 DIAGNOSIS — Z20822 Contact with and (suspected) exposure to covid-19: Secondary | ICD-10-CM | POA: Diagnosis not present

## 2019-04-26 DIAGNOSIS — I10 Essential (primary) hypertension: Secondary | ICD-10-CM | POA: Insufficient documentation

## 2019-04-26 LAB — CBC
HCT: 43.2 % (ref 36.0–46.0)
Hemoglobin: 14.4 g/dL (ref 12.0–15.0)
MCH: 29.8 pg (ref 26.0–34.0)
MCHC: 33.3 g/dL (ref 30.0–36.0)
MCV: 89.4 fL (ref 80.0–100.0)
Platelets: 224 10*3/uL (ref 150–400)
RBC: 4.83 MIL/uL (ref 3.87–5.11)
RDW: 13.9 % (ref 11.5–15.5)
WBC: 5.9 10*3/uL (ref 4.0–10.5)
nRBC: 0 % (ref 0.0–0.2)

## 2019-04-26 LAB — TYPE AND SCREEN
ABO/RH(D): B POS
Antibody Screen: NEGATIVE

## 2019-04-26 LAB — BASIC METABOLIC PANEL
Anion gap: 10 (ref 5–15)
BUN: 12 mg/dL (ref 8–23)
CO2: 26 mmol/L (ref 22–32)
Calcium: 9.3 mg/dL (ref 8.9–10.3)
Chloride: 105 mmol/L (ref 98–111)
Creatinine, Ser: 0.73 mg/dL (ref 0.44–1.00)
GFR calc Af Amer: >60 mL/min (ref >60)
GFR calc non Af Amer: >60 mL/min (ref >60)
Glucose, Bld: 113 mg/dL — ABNORMAL HIGH (ref 70–99)
Potassium: 3.9 mmol/L (ref 3.5–5.1)
Sodium: 141 mmol/L (ref 135–145)

## 2019-04-26 LAB — SARS CORONAVIRUS 2 (TAT 6-24 HRS): SARS Coronavirus 2: NEGATIVE

## 2019-04-30 ENCOUNTER — Encounter
Admission: RE | Disposition: A | Discharge status: Home / Self Care | Payer: Self-pay | Source: Home / Self Care | Attending: Obstetrics and Gynecology

## 2019-04-30 ENCOUNTER — Other Ambulatory Visit: Payer: Self-pay

## 2019-04-30 ENCOUNTER — Ambulatory Visit
Admission: RE | Admit: 2019-04-30 | Discharge: 2019-04-30 | Disposition: A | Discharge status: Home / Self Care | Payer: BC Managed Care – PPO | Attending: Obstetrics and Gynecology | Admitting: Obstetrics and Gynecology

## 2019-04-30 ENCOUNTER — Ambulatory Visit: Payer: BC Managed Care – PPO | Admitting: Anesthesiology

## 2019-04-30 ENCOUNTER — Encounter: Payer: Self-pay | Admitting: Obstetrics and Gynecology

## 2019-04-30 DIAGNOSIS — E039 Hypothyroidism, unspecified: Secondary | ICD-10-CM | POA: Insufficient documentation

## 2019-04-30 DIAGNOSIS — N950 Postmenopausal bleeding: Secondary | ICD-10-CM | POA: Insufficient documentation

## 2019-04-30 DIAGNOSIS — I10 Essential (primary) hypertension: Secondary | ICD-10-CM | POA: Diagnosis not present

## 2019-04-30 DIAGNOSIS — Z6841 Body Mass Index (BMI) 40.0 and over, adult: Secondary | ICD-10-CM | POA: Insufficient documentation

## 2019-04-30 DIAGNOSIS — Z87891 Personal history of nicotine dependence: Secondary | ICD-10-CM | POA: Diagnosis not present

## 2019-04-30 DIAGNOSIS — N840 Polyp of corpus uteri: Secondary | ICD-10-CM | POA: Insufficient documentation

## 2019-04-30 DIAGNOSIS — Z79899 Other long term (current) drug therapy: Secondary | ICD-10-CM | POA: Insufficient documentation

## 2019-04-30 DIAGNOSIS — Z86718 Personal history of other venous thrombosis and embolism: Secondary | ICD-10-CM | POA: Insufficient documentation

## 2019-04-30 DIAGNOSIS — I739 Peripheral vascular disease, unspecified: Secondary | ICD-10-CM | POA: Insufficient documentation

## 2019-04-30 DIAGNOSIS — Z791 Long term (current) use of non-steroidal anti-inflammatories (NSAID): Secondary | ICD-10-CM | POA: Diagnosis not present

## 2019-04-30 DIAGNOSIS — M1990 Unspecified osteoarthritis, unspecified site: Secondary | ICD-10-CM | POA: Diagnosis not present

## 2019-04-30 HISTORY — PX: DILATATION & CURETTAGE/HYSTEROSCOPY WITH MYOSURE: SHX6511

## 2019-04-30 LAB — ABO/RH: ABO/RH(D): B POS

## 2019-04-30 SURGERY — DILATATION & CURETTAGE/HYSTEROSCOPY WITH MYOSURE
Anesthesia: General | Site: Uterus

## 2019-04-30 MED ORDER — PHENYLEPHRINE HCL (PRESSORS) 10 MG/ML IV SOLN
INTRAVENOUS | Status: DC | PRN
  Administered 2019-04-30: 200 ug via INTRAVENOUS

## 2019-04-30 MED ORDER — MIDAZOLAM HCL 2 MG/2ML IJ SOLN
INTRAMUSCULAR | Status: DC | PRN
  Administered 2019-04-30: 1 mg via INTRAVENOUS

## 2019-04-30 MED ORDER — DEXAMETHASONE SODIUM PHOSPHATE 10 MG/ML IJ SOLN
INTRAMUSCULAR | Status: DC | PRN
  Administered 2019-04-30: 10 mg via INTRAVENOUS

## 2019-04-30 MED ORDER — DEXAMETHASONE SODIUM PHOSPHATE 10 MG/ML IJ SOLN
INTRAMUSCULAR | Status: AC
  Filled 2019-04-30: qty 1

## 2019-04-30 MED ORDER — PROPOFOL 10 MG/ML IV BOLUS
INTRAVENOUS | Status: DC | PRN
  Administered 2019-04-30 (×2): 50 mg via INTRAVENOUS
  Administered 2019-04-30: 150 mg via INTRAVENOUS

## 2019-04-30 MED ORDER — ROCURONIUM BROMIDE 10 MG/ML (PF) SYRINGE
PREFILLED_SYRINGE | INTRAVENOUS | Status: AC
  Filled 2019-04-30: qty 10

## 2019-04-30 MED ORDER — IBUPROFEN 800 MG PO TABS: 800.0000 mg | ORAL_TABLET | Freq: Three times a day (TID) | ORAL | 1 refills | Status: AC | PRN

## 2019-04-30 MED ORDER — FENTANYL CITRATE (PF) 100 MCG/2ML IJ SOLN: 25.0000 ug | INTRAMUSCULAR | Status: DC | PRN

## 2019-04-30 MED ORDER — SODIUM CHLORIDE 0.9 % IR SOLN
Status: DC | PRN
  Administered 2019-04-30: 1

## 2019-04-30 MED ORDER — FENTANYL CITRATE (PF) 100 MCG/2ML IJ SOLN
INTRAMUSCULAR | Status: DC | PRN
  Administered 2019-04-30: 50 ug via INTRAVENOUS

## 2019-04-30 MED ORDER — ROCURONIUM BROMIDE 100 MG/10ML IV SOLN
INTRAVENOUS | Status: DC | PRN
  Administered 2019-04-30: 50 mg via INTRAVENOUS

## 2019-04-30 MED ORDER — FENTANYL CITRATE (PF) 100 MCG/2ML IJ SOLN
INTRAMUSCULAR | Status: AC
  Filled 2019-04-30: qty 2

## 2019-04-30 MED ORDER — NALOXONE HCL 0.4 MG/ML IJ SOLN
INTRAMUSCULAR | Status: AC
  Filled 2019-04-30: qty 1

## 2019-04-30 MED ORDER — ONDANSETRON HCL 4 MG/2ML IJ SOLN
INTRAMUSCULAR | Status: AC
  Filled 2019-04-30: qty 4

## 2019-04-30 MED ORDER — FAMOTIDINE 20 MG PO TABS: 20.0000 mg | ORAL_TABLET | Freq: Once | ORAL | Status: AC

## 2019-04-30 MED ORDER — SUGAMMADEX SODIUM 200 MG/2ML IV SOLN
INTRAVENOUS | Status: DC | PRN
  Administered 2019-04-30: 200 mg via INTRAVENOUS

## 2019-04-30 MED ORDER — EPHEDRINE SULFATE 50 MG/ML IJ SOLN
INTRAMUSCULAR | Status: DC | PRN
  Administered 2019-04-30 (×2): 10 mg via INTRAVENOUS

## 2019-04-30 MED ORDER — MIDAZOLAM HCL 2 MG/2ML IJ SOLN
INTRAMUSCULAR | Status: AC
  Filled 2019-04-30: qty 2

## 2019-04-30 MED ORDER — SUCCINYLCHOLINE CHLORIDE 20 MG/ML IJ SOLN
INTRAMUSCULAR | Status: DC | PRN
  Administered 2019-04-30: 120 mg via INTRAVENOUS

## 2019-04-30 MED ORDER — GLYCOPYRROLATE 0.2 MG/ML IJ SOLN
INTRAMUSCULAR | Status: DC | PRN
  Administered 2019-04-30: .2 mg via INTRAVENOUS

## 2019-04-30 MED ORDER — ONDANSETRON HCL 4 MG/2ML IJ SOLN
INTRAMUSCULAR | Status: DC | PRN
  Administered 2019-04-30: 4 mg via INTRAVENOUS

## 2019-04-30 MED ORDER — FAMOTIDINE 20 MG PO TABS
ORAL_TABLET | ORAL | Status: AC
  Administered 2019-04-30: 12:00:00 20 mg via ORAL
  Filled 2019-04-30: qty 1

## 2019-04-30 MED ORDER — SILVER NITRATE-POT NITRATE 75-25 % EX MISC
CUTANEOUS | Status: AC
  Filled 2019-04-30: qty 10

## 2019-04-30 MED ORDER — ONDANSETRON HCL 4 MG/2ML IJ SOLN: 4.0000 mg | Freq: Once | INTRAMUSCULAR | Status: DC | PRN

## 2019-04-30 MED ORDER — LIDOCAINE HCL (CARDIAC) PF 100 MG/5ML IV SOSY
PREFILLED_SYRINGE | INTRAVENOUS | Status: DC | PRN
  Administered 2019-04-30: 100 mg via INTRAVENOUS

## 2019-04-30 MED ORDER — KETOROLAC TROMETHAMINE 30 MG/ML IJ SOLN
INTRAMUSCULAR | Status: AC
  Filled 2019-04-30: qty 1

## 2019-04-30 MED ORDER — LACTATED RINGERS IV SOLN: INTRAVENOUS | Status: DC

## 2019-04-30 MED ORDER — DOCUSATE SODIUM 100 MG PO CAPS
100.0000 mg | ORAL_CAPSULE | Freq: Two times a day (BID) | ORAL | 0 refills | Status: AC
Start: 2019-04-30 — End: ?

## 2019-04-30 MED ORDER — FLUMAZENIL 0.5 MG/5ML IV SOLN
INTRAVENOUS | Status: AC
  Filled 2019-04-30: qty 5

## 2019-04-30 SURGICAL SUPPLY — 17 items
CANISTER SUC SOCK COL 7IN (MISCELLANEOUS) ×2 IMPLANT
CANISTER SUCT 3000ML PPV (MISCELLANEOUS) ×2 IMPLANT
CATH ROBINSON RED A/P 16FR (CATHETERS) ×2 IMPLANT
COVER WAND RF STERILE (DRAPES) ×2 IMPLANT
DEVICE MYOSURE LITE (MISCELLANEOUS) ×2 IMPLANT
GLOVE BIO SURGEON STRL SZ7 (GLOVE) ×2 IMPLANT
GLOVE INDICATOR 7.5 STRL GRN (GLOVE) ×2 IMPLANT
GOWN STRL REUS W/ TWL LRG LVL3 (GOWN DISPOSABLE) ×2 IMPLANT
GOWN STRL REUS W/TWL LRG LVL3 (GOWN DISPOSABLE) ×2
KIT PROCEDURE FLUENT (KITS) ×2 IMPLANT
KIT TURNOVER CYSTO (KITS) ×2 IMPLANT
PACK DNC HYST (MISCELLANEOUS) ×2 IMPLANT
PAD OB MATERNITY 4.3X12.25 (PERSONAL CARE ITEMS) ×2 IMPLANT
PAD PREP 24X41 OB/GYN DISP (PERSONAL CARE ITEMS) ×2 IMPLANT
SOL .9 NS 3000ML IRR  AL (IV SOLUTION) ×1
SOL .9 NS 3000ML IRR UROMATIC (IV SOLUTION) ×1 IMPLANT
TUBING CONNECTING 10 (TUBING) ×2 IMPLANT

## 2019-04-30 NOTE — Transfer of Care (Signed)
Immediate Anesthesia Transfer of Care Note  Patient: Carrie Cole  Procedure(s) Performed: DILATATION & CURETTAGE/HYSTEROSCOPY WITH MYOSURE, POLYPECTOMY, POSSIBLE MYOMECTOMY (N/A Uterus)  Patient Location: PACU  Anesthesia Type:General  Level of Consciousness: awake  Airway & Oxygen Therapy: Patient Spontanous Breathing  Post-op Assessment: Report given to RN  Post vital signs: stable  Last Vitals:  Vitals Value Taken Time  BP    Temp    Pulse 88 04/30/19 1348  Resp 12 04/30/19 1348  SpO2 98 % 04/30/19 1348  Vitals shown include unvalidated device data.  Last Pain:  Vitals:   04/30/19 1136  TempSrc: Temporal  PainSc: 0-No pain         Complications: No apparent anesthesia complications

## 2019-04-30 NOTE — Anesthesia Postprocedure Evaluation (Signed)
Anesthesia Post Note  Patient: Carrie Cole  Procedure(s) Performed: DILATATION & CURETTAGE/HYSTEROSCOPY WITH MYOSURE, POLYPECTOMY, POSSIBLE MYOMECTOMY (N/A Uterus)  Patient location during evaluation: PACU Anesthesia Type: General Level of consciousness: awake and alert and oriented Pain management: pain level controlled Vital Signs Assessment: post-procedure vital signs reviewed and stable Respiratory status: spontaneous breathing Cardiovascular status: blood pressure returned to baseline Anesthetic complications: no     Last Vitals:  Vitals:   04/30/19 1426 04/30/19 1434  BP: 121/64 (!) 136/55  Pulse: (!) 54 (!) 59  Resp: 11 14  Temp:  (!) 36.2 C  SpO2: 97% 98%    Last Pain:  Vitals:   04/30/19 1434  TempSrc: Temporal  PainSc: 0-No pain                 Nafisah Runions

## 2019-04-30 NOTE — Progress Notes (Signed)
Heart rate as low as 55  Blood pressure good  Dr Kayleen Memos  Aware okay with this

## 2019-04-30 NOTE — Discharge Instructions (Addendum)
Discharge instructions after a hysteroscopy with dilation and curettage  Signs and Symptoms to Report  Call our office at [phone] if you have any of the following:   . Fever over 100.4 degrees or higher . Severe stomach pain not relieved with pain medications . Bright red bleeding that's heavier than a period that does not slow with rest after the first 24 hours . To go the bathroom a lot (frequency), you can't hold your urine (urgency), or it hurts when you empty your bladder (urinate) . Chest pain . Shortness of breath . Pain in the calves of your legs . Severe nausea and vomiting not relieved with anti-nausea medications . Any concerns  What You Can Expect after Surgery . You may see some pink tinged, bloody fluid. This is normal. You may also have cramping for several days.   Activities after Your Discharge Follow these guidelines to help speed your recovery at home: . Don't drive if you are in pain or taking narcotic pain medicine. You may drive when you can safely slam on the brakes, turn the wheel forcefully, and rotate your torso comfortably. This is typically 4-7 days. Practice in a parking lot or side street prior to attempting to drive regularly.  . Ask others to help with household chores for 4 weeks. . Don't do strenuous activities, exercises, or sports like vacuuming, tennis, squash, etc. until your doctor says it is safe to do so. . Walk as you feel able. Rest often since it may take a week or two for your energy level to return to normal.  . You may climb stairs . Avoid constipation:   -Eat fruits, vegetables, and whole grains. Eat small meals as your appetite will take time to return to normal.   -Drink 6 to 8 glasses of water each day unless your doctor has told you to limit your fluids.   -Use a laxative or stool softener as needed if constipation becomes a problem. You may take Miralax, metamucil, Citrucil, Colace, Senekot, FiberCon, etc. If this does not  relieve the constipation, try two tablespoons of Milk Of Magnesia every 8 hours until your bowels move.  . You may shower.  . Do not get in a hot tub, swimming pool, etc. until your doctor agrees. . Do not douche, use tampons, or have sex until your doctor says it is okay, usually about 2 weeks. . Take your pain medicine when you need it. The medicine may not work as well if the pain is bad.  Take the medicines you were taking before surgery. Other medications you might need are pain medications (ibuprofen), medications for constipation (Colace) and nausea medications (Zofran).        AMBULATORY SURGERY  DISCHARGE INSTRUCTIONS   1) The drugs that you were given will stay in your system until tomorrow so for the next 24 hours you should not:  A) Drive an automobile B) Make any legal decisions C) Drink any alcoholic beverage   2) You may resume regular meals tomorrow.  Today it is better to start with liquids and gradually work up to solid foods.  You may eat anything you prefer, but it is better to start with liquids, then soup and crackers, and gradually work up to solid foods.   3) Please notify your doctor immediately if you have any unusual bleeding, trouble breathing, redness and pain at the surgery site, drainage, fever, or pain not relieved by medication.    4) Additional Instructions:          Please contact your physician with any problems or Same Day Surgery at [phone], Monday through Friday 6 am to 4 pm, or [place] [facility] [place] [facility] number at [phone]. 

## 2019-04-30 NOTE — Interval H&P Note (Signed)
History and Physical Interval Note:  04/30/2019 12:14 PM  Carrie Cole  has presented today for surgery, with the diagnosis of post menopausal bleeding, endometrial polyp.  The various methods of treatment have been discussed with the patient and family. After consideration of risks, benefits and other options for treatment, the patient has consented to  Procedure(s): [place], POLYPECTOMY, POSSIBLE MYOMECTOMY (N/A) as a surgical intervention.  The patient's history has been reviewed, patient examined, no change in status, stable for surgery.  I have reviewed the patient's chart and labs.  Questions were answered to the patient's satisfaction.     Benjaman Kindler

## 2019-04-30 NOTE — Anesthesia Preprocedure Evaluation (Signed)
Anesthesia Evaluation  Patient identified by MRN, date of birth, ID band Patient awake    Reviewed: Allergy & Precautions, NPO status , Patient's Chart, lab work & pertinent test results, reviewed documented beta blocker date and time   Airway Mallampati: II  TM Distance: >3 FB     Dental  (+) Chipped   Pulmonary former smoker,           Cardiovascular hypertension, Pt. on medications + Peripheral Vascular Disease and + DVT       Neuro/Psych negative neurological ROS  negative psych ROS   GI/Hepatic negative GI ROS, Neg liver ROS,   Endo/Other  Hypothyroidism Morbid obesity  Renal/GU negative Renal ROS  negative genitourinary   Musculoskeletal  (+) Arthritis ,   Abdominal   Peds  Hematology negative hematology ROS (+)   Anesthesia Other Findings Past Medical History: No date: Allergic rhinitis No date: Arthritis No date: History of kidney stones No date: Hypertension No date: Hypothyroidism No date: Peripheral vascular disease (HCC)     Comment:  DVT  (Left Leg)  Reproductive/Obstetrics                             Anesthesia Physical  Anesthesia Plan  ASA: III  Anesthesia Plan: General   Post-op Pain Management:    Induction: Intravenous  PONV Risk Score and Plan:   Airway Management Planned: Oral ETT  Additional Equipment:   Intra-op Plan:   Post-operative Plan: Extubation in OR  Informed Consent: I have reviewed the patients History and Physical, chart, labs and discussed the procedure including the risks, benefits and alternatives for the proposed anesthesia with the patient or authorized representative who has indicated his/her understanding and acceptance.       Plan Discussed with: CRNA  Anesthesia Plan Comments:         Anesthesia Quick Evaluation

## 2019-04-30 NOTE — Op Note (Signed)
Operative Report Hysteroscopy with Dilation and Curettage   Indications: Postmenopausal bleeding    Pre-operative Diagnosis: Endometrial polyp   Post-operative Diagnosis: same.  Procedure: 1. Exam under anesthesia 2. Fractional D&C 3. Hysteroscopy 4. Endometrial polypectomy, using myosure device  Surgeon: Benjaman Kindler, MD  Assistant(s):  None  Anesthesia: General LMA anesthesia  Anesthesiologist: Alvin Critchley, MD Anesthesiologist: Alvin Critchley, MD CRNA: Leander Rams, CRNA  Estimated Blood Loss:  Minimal         Intraoperative medications: na         Total IV Fluids: 88ml  Urine Output: 141ml  Total Fluid Deficit:  20 mL          Specimens: Endocervical curettings, endometrial curettings, endometrial polyp         Complications:  None; patient tolerated the procedure well.         Disposition: PACU - hemodynamically stable.         Condition: stable  Findings: Uterus measuring 11 cm by sound; normal cervix, vagina, perineum. Two large fundal polyps, one with large base. Multiple small yellow-white inclusions that may have been calcified or cholesterol.  Indication for procedure/Consents: 59 y.o. F  here for scheduled surgery for the aforementioned diagnoses.  Risks of surgery were discussed with the patient including but not limited to: bleeding which may require transfusion; infection which may require antibiotics; injury to uterus or surrounding organs; intrauterine scarring which may impair future fertility; need for additional procedures including laparotomy or laparoscopy; and other postoperative/anesthesia complications. Written informed consent was obtained.    Procedure Details:  D&C/ Myosure  The patient was taken to the operating room where anesthesia was administered and was found to be adequate. After a formal and adequate timeout was performed, she was placed in the dorsal lithotomy position and examined with the above findings. She was then  prepped and draped in the sterile manner. Her bladder was catheterized for an estimated amount of clear, yellow urine. A weighed speculum was then placed in the patient's vagina and a single tooth tenaculum was applied to the anterior lip of the cervix.  Her cervix was serially dilated to 15 [place] using Hanks dilators. An ECC was performed. The hysteroscope was introduced under direct observation  Using lactated ringers as a distention medium to reveal the above findings. The uterine cavity was carefully examined, both ostia were recognized, and diffusely proliferative endometrium with the polyps above were noted.   These were resected using the Myosure device.  After further careful visualization of the uterine cavity, the hysteroscope was removed under direct visualization.  A sharp curettage was then performed until there was a gritty texture in all four quadrants. The tenaculum was removed from the anterior lip of the cervix and the vaginal speculum was removed after applying silver nitrate for good hemostasis.   The patient tolerated the procedure well and was taken to the recovery area awake and in stable condition. She received iv acetaminophen and Toradol prior to leaving the OR.  The patient will be discharged to home as per PACU criteria. Routine postoperative instructions given. She was prescribed Ibuprofen and Colace. She will follow up in the clinic in two weeks for postoperative evaluation.

## 2019-04-30 NOTE — Anesthesia Procedure Notes (Signed)
Procedure Name: Intubation Date/Time: 04/30/2019 12:37 PM Performed by: Leander Rams, CRNA Pre-anesthesia Checklist: Patient identified, Emergency Drugs available, Suction available, Patient being monitored and Timeout performed Patient Re-evaluated:Patient Re-evaluated prior to induction Oxygen Delivery Method: Circle system utilized Preoxygenation: Pre-oxygenation with 100% oxygen Induction Type: IV induction Ventilation: Mask ventilation without difficulty Laryngoscope Size: McGraph and 4 Grade View: Grade I Tube type: Oral Tube size: 7.0 mm Number of attempts: 1 Airway Equipment and Method: Patient positioned with wedge pillow and Stylet Placement Confirmation: ETT inserted through vocal cords under direct vision Secured at: 21 cm Tube secured with: Tape Dental Injury: Teeth and Oropharynx as per pre-operative assessment

## 2019-05-03 LAB — SURGICAL PATHOLOGY

## 2020-01-01 IMAGING — US US EXTREM LOW VENOUS*L*
1 series · 14 of 24 positions shown · non-contrast
Comparison: 05/29/2015

CLINICAL DATA: Pain and swelling, history of DVT

EXAM:
LEFT LOWER EXTREMITY VENOUS DOPPLER ULTRASOUND
TECHNIQUE: Gray-scale sonography with compression, as well as color and duplex
ultrasound, were performed to evaluate the deep venous system from
the level of the common femoral vein through the popliteal and
proximal calf veins.

[Series 1: us extrem low venous*left* · 0.09mm/px · 14 of 37 slices shown]
[im 1/37]
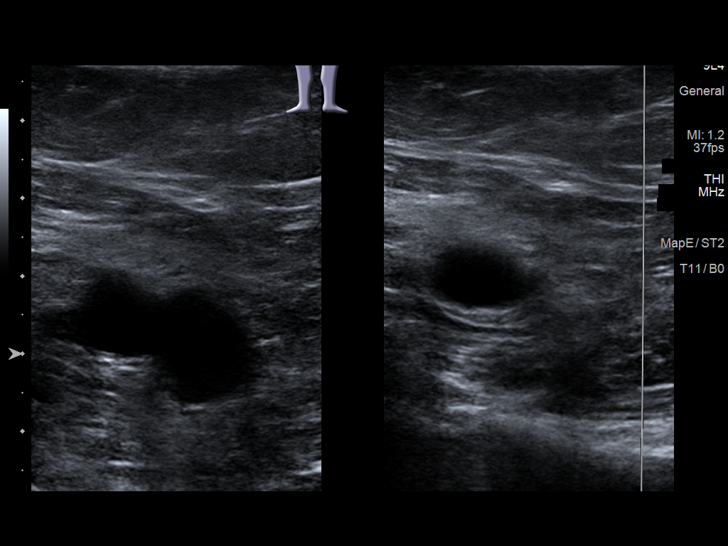
[im 4/37]
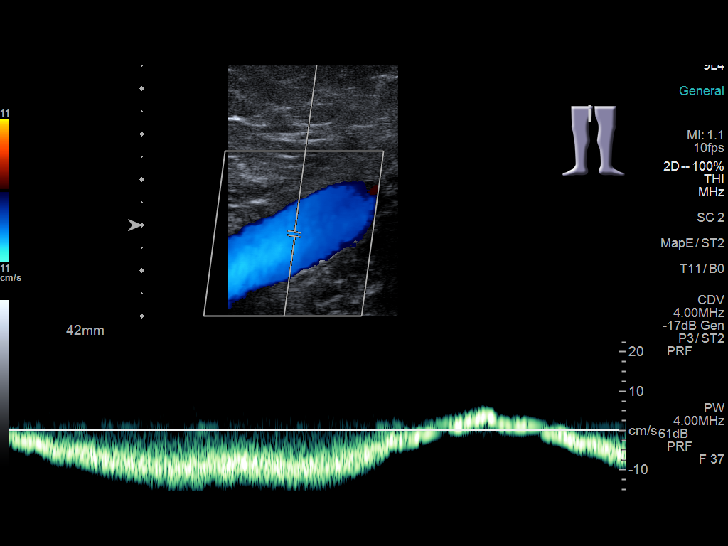
[im 7/37]
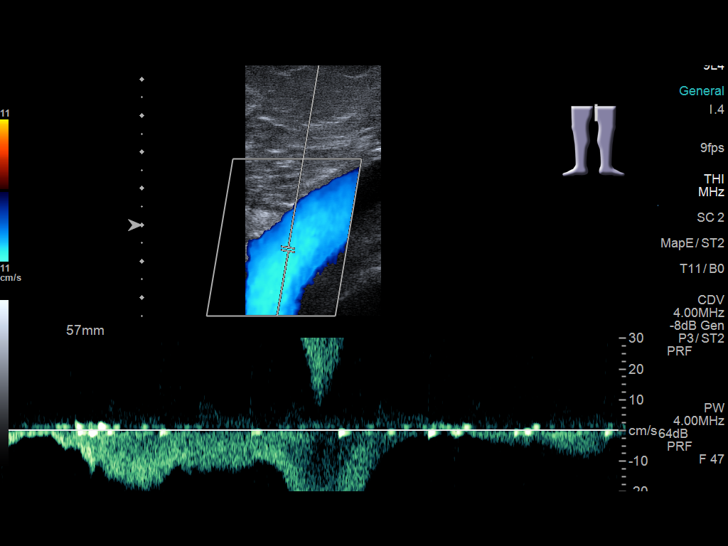
[im 10/37]
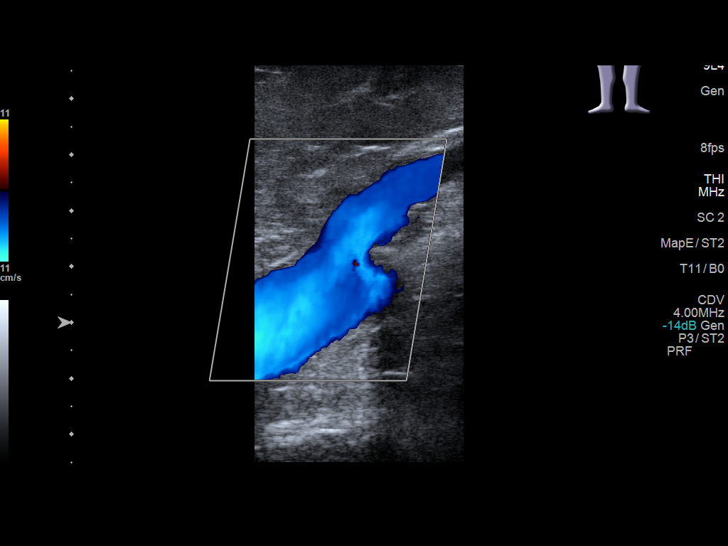
[im 11/37]
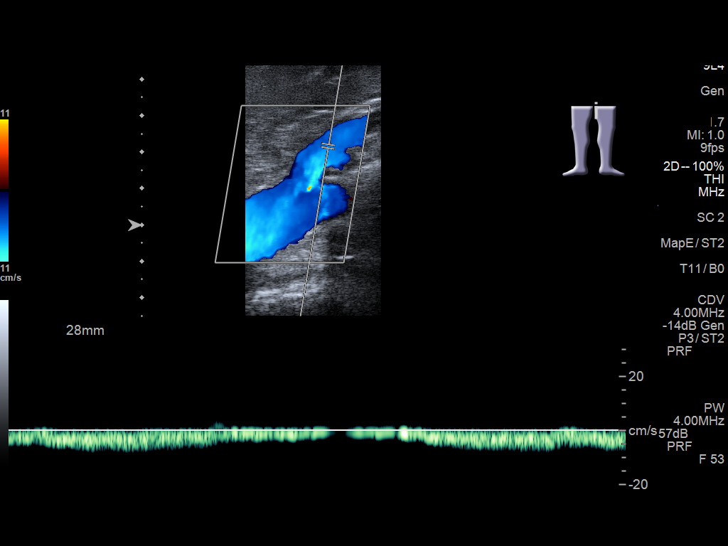
[im 15/37]
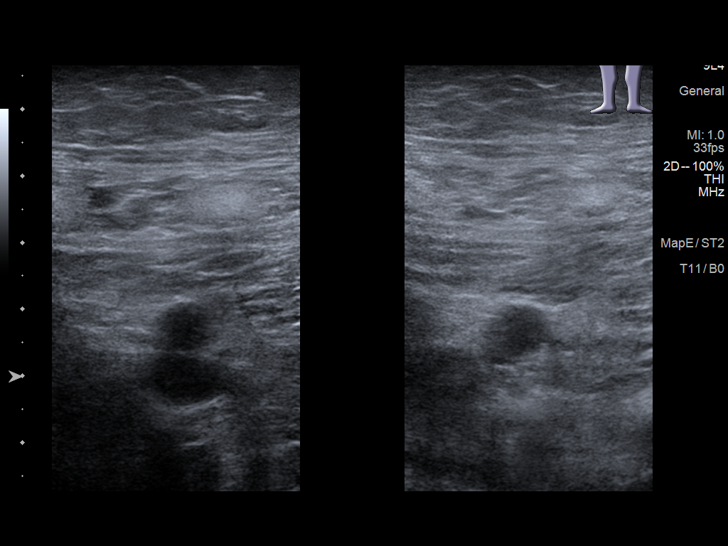
[im 18/37]
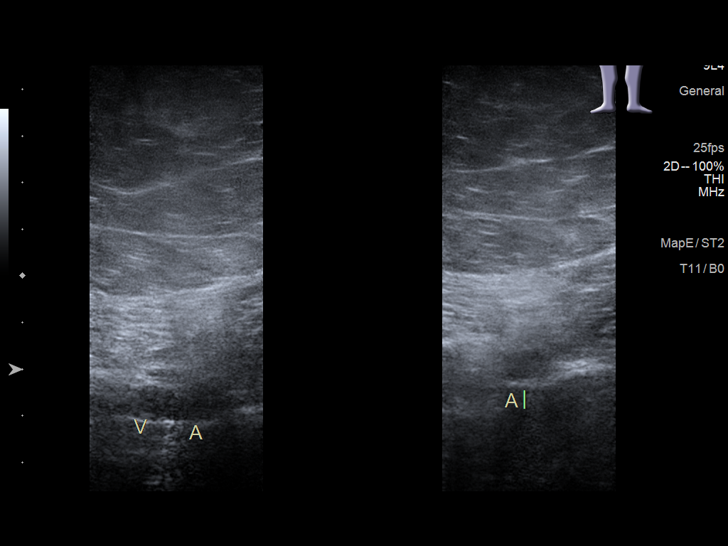
[im 19/37]
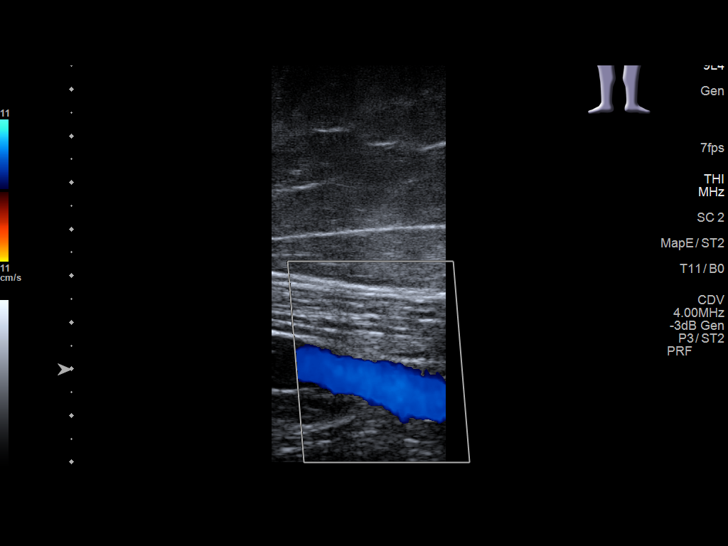
[im 22/37]
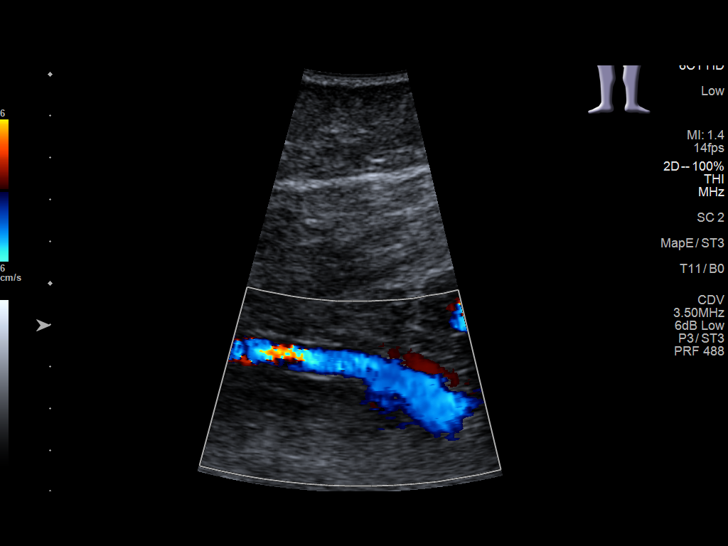
[im 26/37]
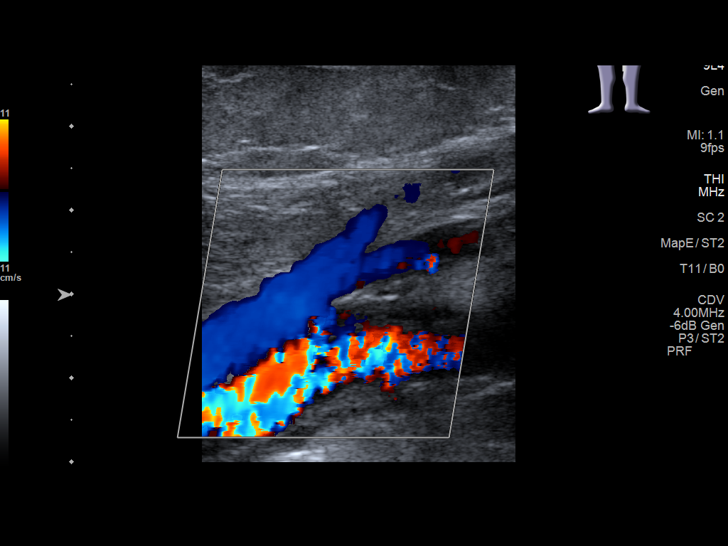
[im 29/37]
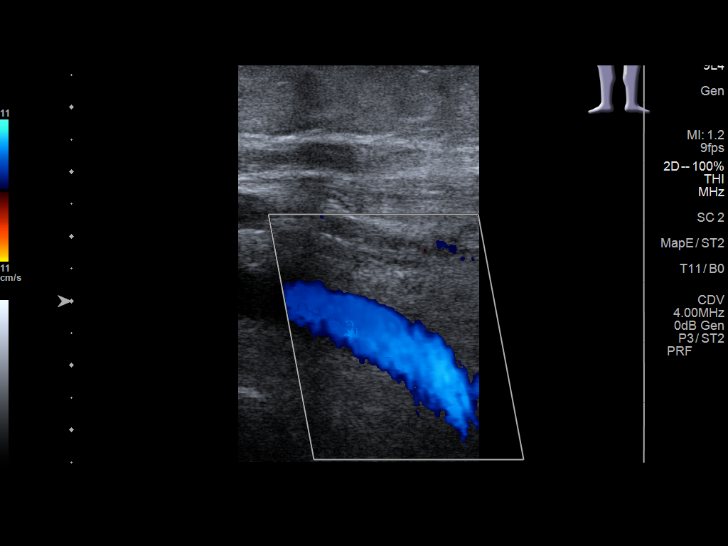
[im 30/37]
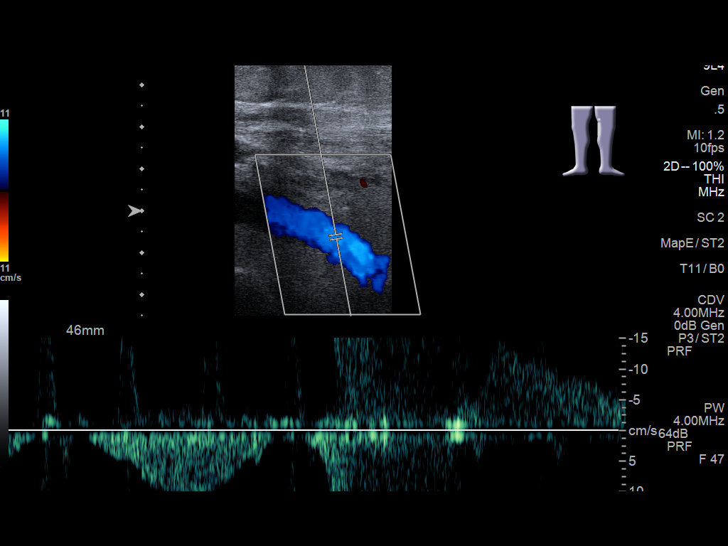
[im 33/37]
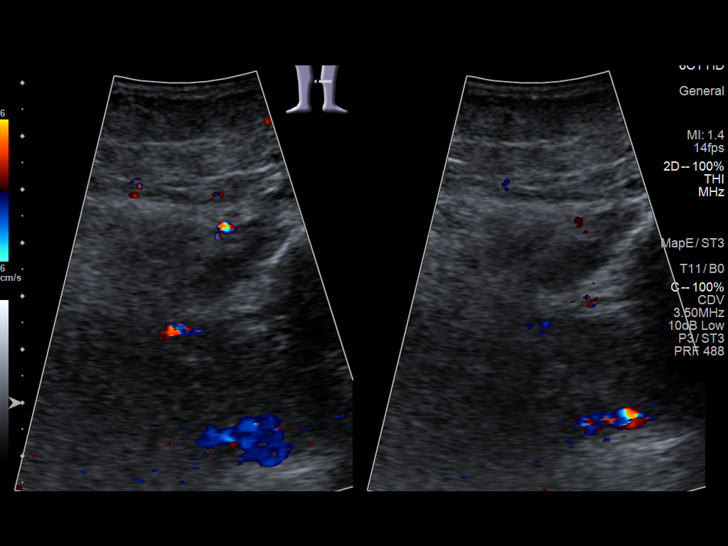
[im 37/37]
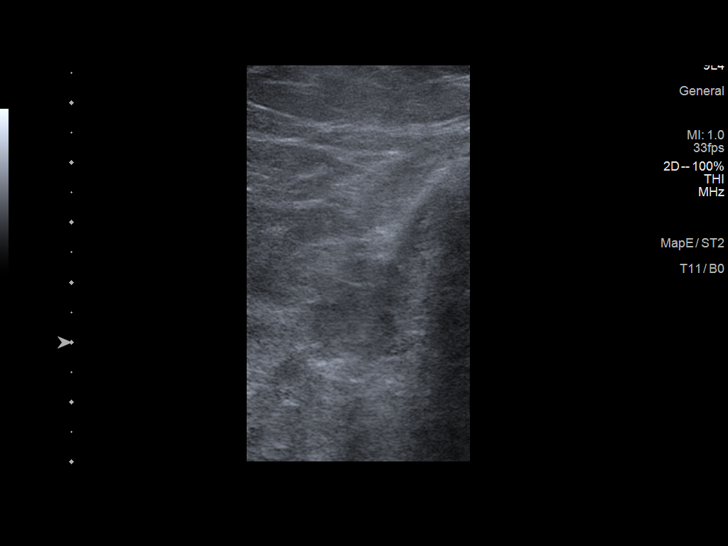

[14 of 24 positions shown; findings below may reference images not displayed]

FINDINGS: Normal compressibility of the common femoral, superficial femoral,
and popliteal veins, as well as the proximal calf veins. No filling
defects to suggest DVT on grayscale or color Doppler imaging.
Doppler waveforms show normal direction of venous flow, normal
respiratory phasicity and response to augmentation. Survey views of
the contralateral common femoral vein are unremarkable.
IMPRESSION: No femoropopliteal and no calf DVT in the visualized calf veins. If
clinical symptoms are inconsistent or if there are persistent or
worsening symptoms, further imaging (possibly involving the iliac
veins) may be warranted.

## 2021-06-10 ENCOUNTER — Other Ambulatory Visit: Payer: Self-pay | Admitting: Family Medicine

## 2021-06-10 DIAGNOSIS — Z1231 Encounter for screening mammogram for malignant neoplasm of breast: Secondary | ICD-10-CM

## 2021-07-10 ENCOUNTER — Ambulatory Visit
Admission: RE | Admit: 2021-07-10 | Discharge: 2021-07-10 | Disposition: A | Discharge status: Home / Self Care | Payer: BC Managed Care – PPO | Source: Ambulatory Visit | Attending: Family Medicine | Admitting: Family Medicine

## 2021-07-10 DIAGNOSIS — Z1231 Encounter for screening mammogram for malignant neoplasm of breast: Secondary | ICD-10-CM | POA: Insufficient documentation

## 2021-10-29 DIAGNOSIS — M25561 Pain in right knee: Secondary | ICD-10-CM | POA: Diagnosis not present

## 2021-10-29 DIAGNOSIS — Z8739 Personal history of other diseases of the musculoskeletal system and connective tissue: Secondary | ICD-10-CM | POA: Diagnosis not present

## 2023-06-13 DIAGNOSIS — R7303 Prediabetes: Secondary | ICD-10-CM | POA: Diagnosis not present

## 2023-06-13 DIAGNOSIS — E039 Hypothyroidism, unspecified: Secondary | ICD-10-CM | POA: Diagnosis not present

## 2023-06-13 DIAGNOSIS — Z0000 Encounter for general adult medical examination without abnormal findings: Secondary | ICD-10-CM | POA: Diagnosis not present

## 2023-06-20 DIAGNOSIS — Z1331 Encounter for screening for depression: Secondary | ICD-10-CM | POA: Diagnosis not present

## 2023-06-20 DIAGNOSIS — R7303 Prediabetes: Secondary | ICD-10-CM | POA: Diagnosis not present

## 2023-06-20 DIAGNOSIS — E039 Hypothyroidism, unspecified: Secondary | ICD-10-CM | POA: Diagnosis not present

## 2023-06-20 DIAGNOSIS — I10 Essential (primary) hypertension: Secondary | ICD-10-CM | POA: Diagnosis not present

## 2023-06-20 DIAGNOSIS — Z780 Asymptomatic menopausal state: Secondary | ICD-10-CM | POA: Diagnosis not present

## 2023-06-20 DIAGNOSIS — Z0000 Encounter for general adult medical examination without abnormal findings: Secondary | ICD-10-CM | POA: Diagnosis not present

## 2023-06-20 DIAGNOSIS — Z6841 Body Mass Index (BMI) 40.0 and over, adult: Secondary | ICD-10-CM | POA: Diagnosis not present

## 2023-07-01 ENCOUNTER — Other Ambulatory Visit: Payer: Self-pay | Admitting: Family Medicine

## 2023-07-01 DIAGNOSIS — Z1231 Encounter for screening mammogram for malignant neoplasm of breast: Secondary | ICD-10-CM

## 2023-07-12 DIAGNOSIS — M8588 Other specified disorders of bone density and structure, other site: Secondary | ICD-10-CM | POA: Diagnosis not present

## 2023-07-13 ENCOUNTER — Ambulatory Visit
Admission: RE | Admit: 2023-07-13 | Discharge: 2023-07-13 | Disposition: A | Discharge status: Home / Self Care | Source: Ambulatory Visit | Attending: Family Medicine | Admitting: Family Medicine

## 2023-07-13 DIAGNOSIS — Z1231 Encounter for screening mammogram for malignant neoplasm of breast: Secondary | ICD-10-CM | POA: Insufficient documentation
